# Patient Record
Sex: Male | Born: 1955 | Race: White | Hispanic: No | Marital: Married | Smoking: Never smoker
Health system: Southern US, Community
[De-identification: ages and names within clinical notes are randomized; demographics above are authoritative.]

## PROBLEM LIST (undated history)

## (undated) DIAGNOSIS — K219 Gastro-esophageal reflux disease without esophagitis: Secondary | ICD-10-CM

## (undated) DIAGNOSIS — G43909 Migraine, unspecified, not intractable, without status migrainosus: Secondary | ICD-10-CM

## (undated) HISTORY — PX: TONSILLECTOMY: SUR1361

## (undated) HISTORY — PX: KNEE ARTHROSCOPY: SUR90

---

## 2004-07-28 ENCOUNTER — Ambulatory Visit (HOSPITAL_COMMUNITY): Admission: RE | Admit: 2004-07-28 | Discharge: 2004-07-28 | Payer: Self-pay | Admitting: Orthopedic Surgery

## 2006-07-04 IMAGING — CR DG ORBITS FOR FOREIGN BODY
2 series · 2 of 2 positions shown · non-contrast
Comparison: none

CLINICAL DATA: 48-year-old machinist.  Pre-MRI orbits.
 2-VIEW ORBITS:
 Two views of the orbits demonstrate no metallic foreign bodies overlying the orbits.  There is a tiny foreign body over the maxillary sinus which should not be a problem for MRI.

[view not recorded (1 of 2)]
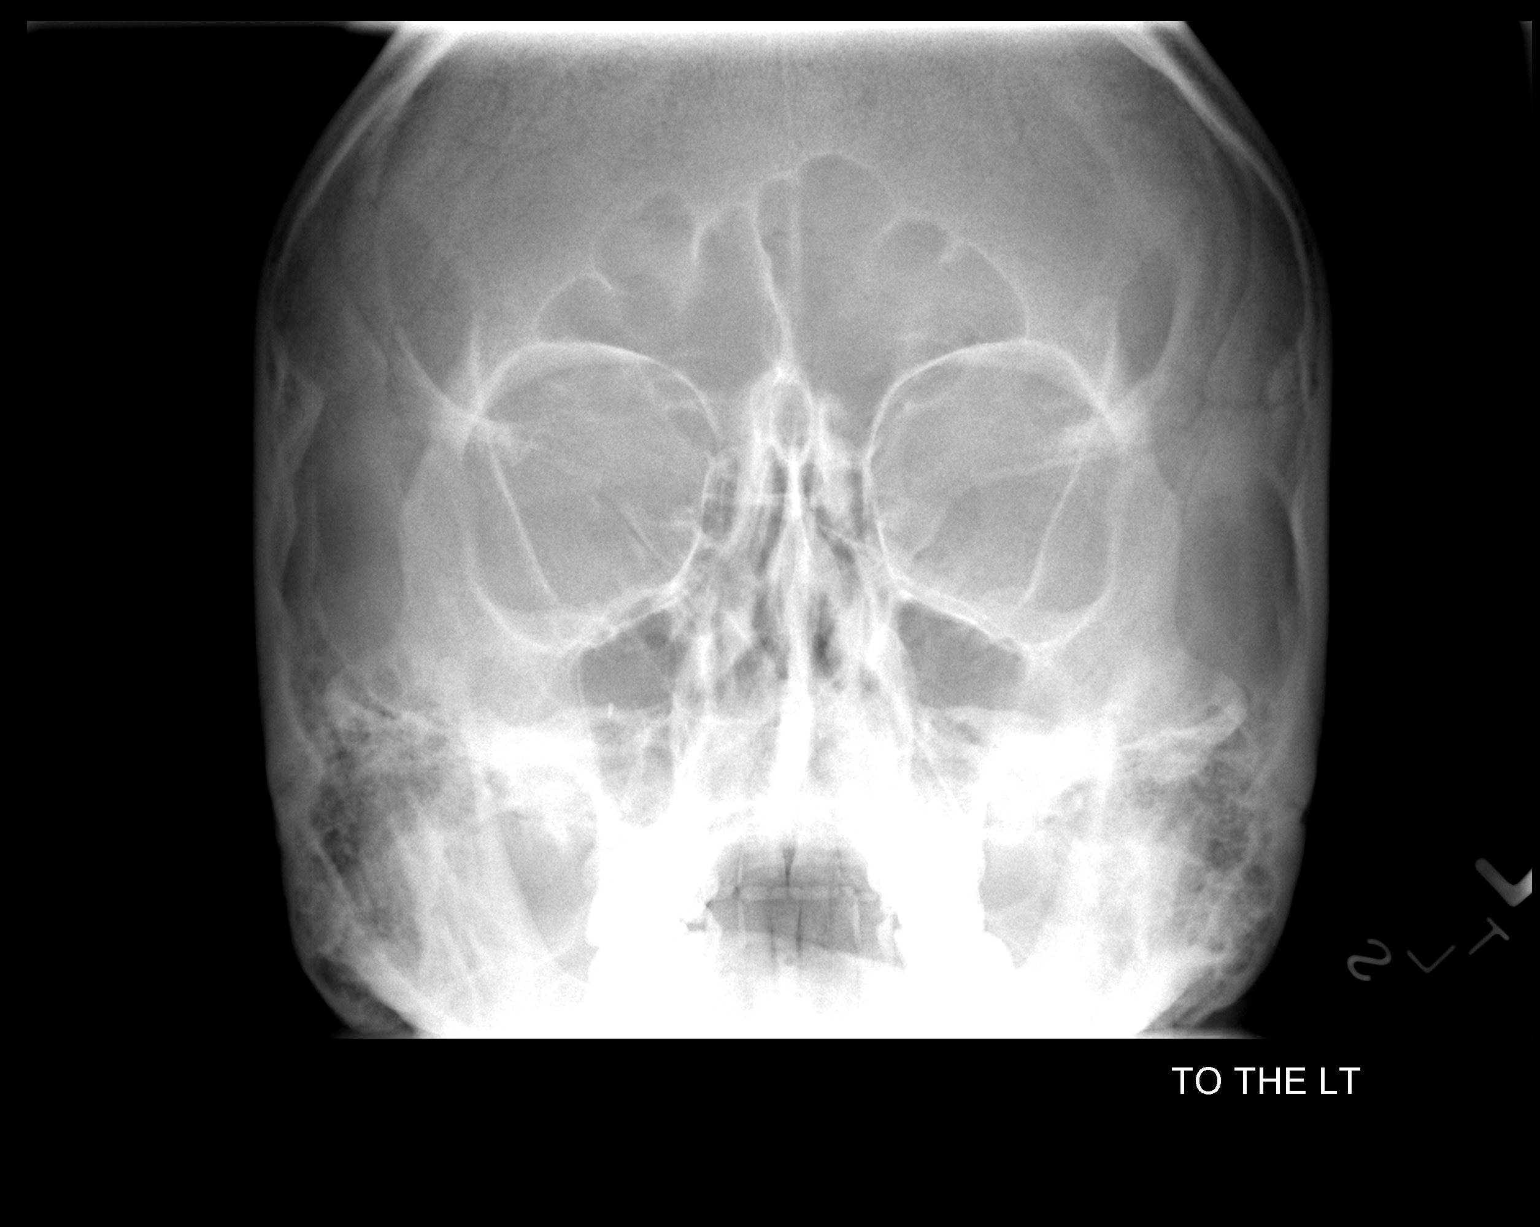

[view not recorded (2 of 2)]
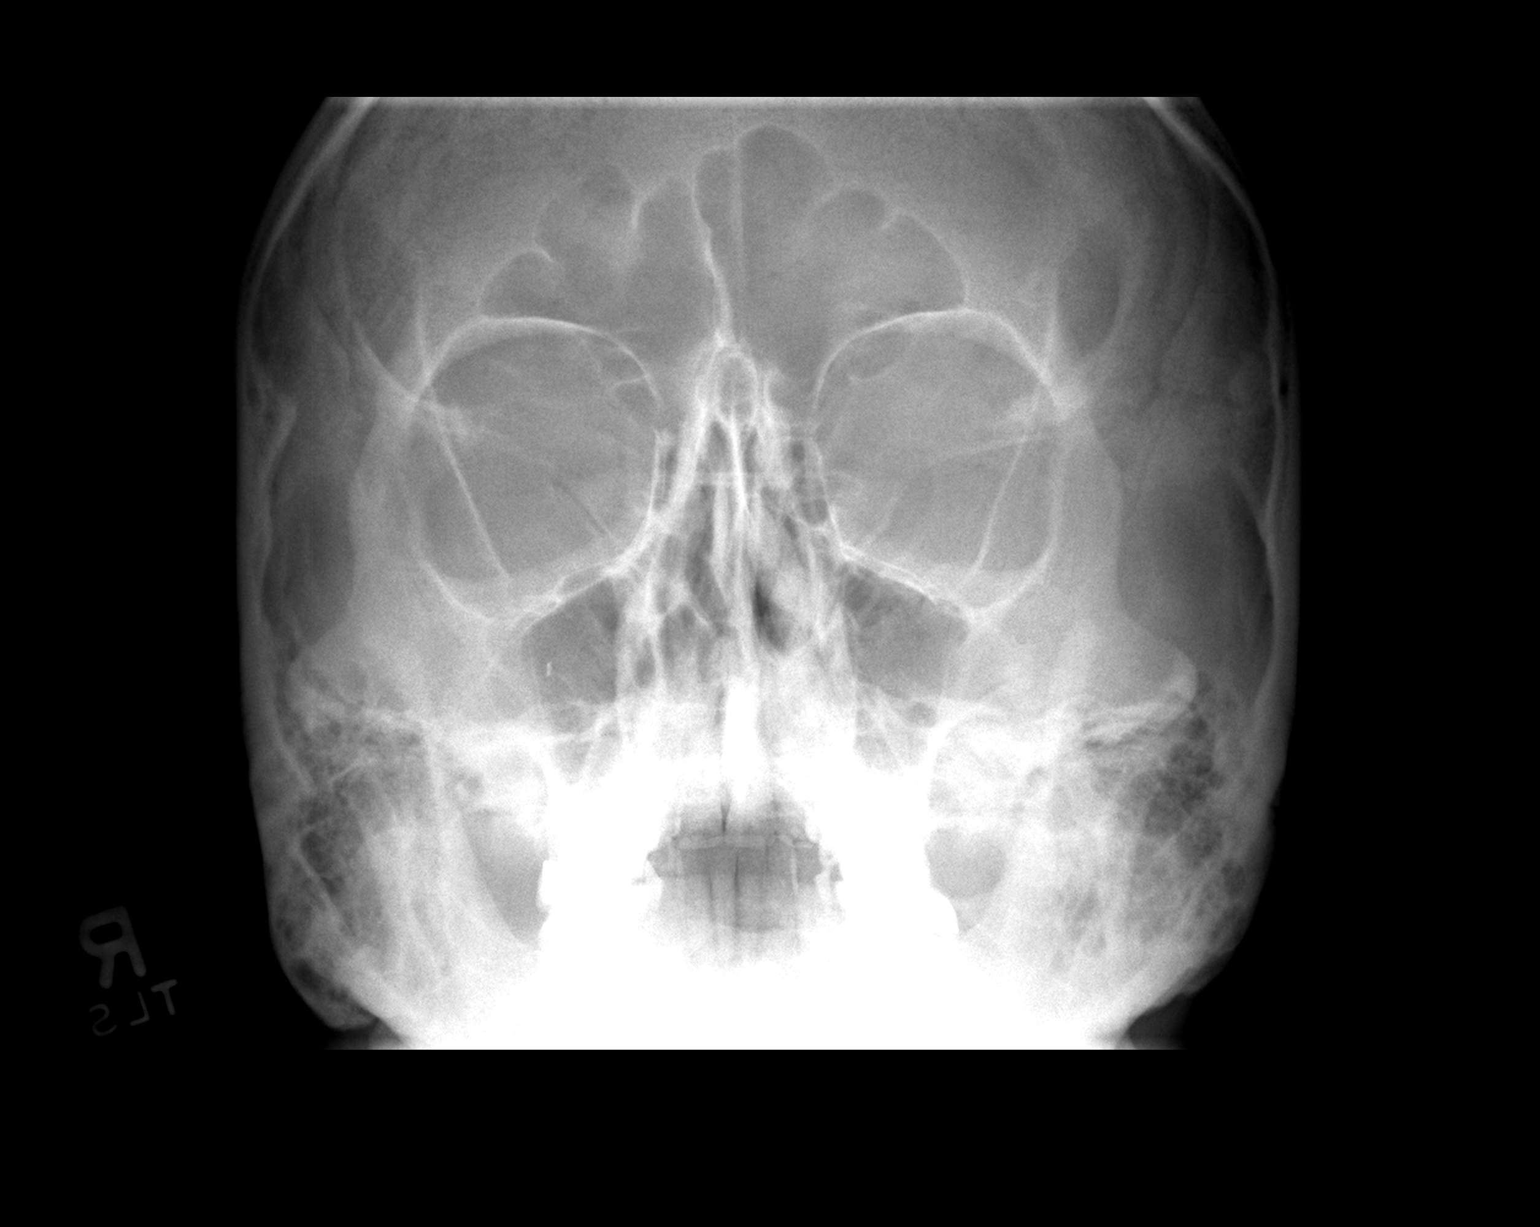

[2 of 2 positions shown; findings below may reference images not displayed]

IMPRESSION: Negative orbits for metallic foreign body.

## 2015-11-24 DIAGNOSIS — G43909 Migraine, unspecified, not intractable, without status migrainosus: Secondary | ICD-10-CM | POA: Diagnosis not present

## 2015-11-24 DIAGNOSIS — K219 Gastro-esophageal reflux disease without esophagitis: Secondary | ICD-10-CM | POA: Diagnosis not present

## 2016-11-23 DIAGNOSIS — Z23 Encounter for immunization: Secondary | ICD-10-CM | POA: Diagnosis not present

## 2016-12-04 DIAGNOSIS — Z0000 Encounter for general adult medical examination without abnormal findings: Secondary | ICD-10-CM | POA: Diagnosis not present

## 2016-12-04 DIAGNOSIS — Z125 Encounter for screening for malignant neoplasm of prostate: Secondary | ICD-10-CM | POA: Diagnosis not present

## 2016-12-04 DIAGNOSIS — Z1331 Encounter for screening for depression: Secondary | ICD-10-CM | POA: Diagnosis not present

## 2016-12-04 DIAGNOSIS — Z1339 Encounter for screening examination for other mental health and behavioral disorders: Secondary | ICD-10-CM | POA: Diagnosis not present

## 2016-12-04 DIAGNOSIS — Z79899 Other long term (current) drug therapy: Secondary | ICD-10-CM | POA: Diagnosis not present

## 2016-12-04 DIAGNOSIS — Z6826 Body mass index (BMI) 26.0-26.9, adult: Secondary | ICD-10-CM | POA: Diagnosis not present

## 2016-12-05 DIAGNOSIS — Z1211 Encounter for screening for malignant neoplasm of colon: Secondary | ICD-10-CM | POA: Diagnosis not present

## 2016-12-19 DIAGNOSIS — M25561 Pain in right knee: Secondary | ICD-10-CM | POA: Diagnosis not present

## 2016-12-24 DIAGNOSIS — Z1212 Encounter for screening for malignant neoplasm of rectum: Secondary | ICD-10-CM | POA: Diagnosis not present

## 2016-12-24 DIAGNOSIS — Z1211 Encounter for screening for malignant neoplasm of colon: Secondary | ICD-10-CM | POA: Diagnosis not present

## 2017-08-18 DIAGNOSIS — J209 Acute bronchitis, unspecified: Secondary | ICD-10-CM | POA: Diagnosis not present

## 2017-08-18 DIAGNOSIS — J0100 Acute maxillary sinusitis, unspecified: Secondary | ICD-10-CM | POA: Diagnosis not present

## 2017-11-12 DIAGNOSIS — Z0001 Encounter for general adult medical examination with abnormal findings: Secondary | ICD-10-CM | POA: Diagnosis not present

## 2017-11-12 DIAGNOSIS — Z13220 Encounter for screening for lipoid disorders: Secondary | ICD-10-CM | POA: Diagnosis not present

## 2017-11-12 DIAGNOSIS — Z125 Encounter for screening for malignant neoplasm of prostate: Secondary | ICD-10-CM | POA: Diagnosis not present

## 2017-11-12 DIAGNOSIS — Z6825 Body mass index (BMI) 25.0-25.9, adult: Secondary | ICD-10-CM | POA: Diagnosis not present

## 2017-11-12 DIAGNOSIS — G43909 Migraine, unspecified, not intractable, without status migrainosus: Secondary | ICD-10-CM | POA: Diagnosis not present

## 2017-11-12 DIAGNOSIS — Z0000 Encounter for general adult medical examination without abnormal findings: Secondary | ICD-10-CM | POA: Diagnosis not present

## 2017-11-12 DIAGNOSIS — Z23 Encounter for immunization: Secondary | ICD-10-CM | POA: Diagnosis not present

## 2017-11-12 DIAGNOSIS — Z79899 Other long term (current) drug therapy: Secondary | ICD-10-CM | POA: Diagnosis not present

## 2018-01-23 DIAGNOSIS — M25512 Pain in left shoulder: Secondary | ICD-10-CM | POA: Diagnosis not present

## 2018-02-01 DIAGNOSIS — M25512 Pain in left shoulder: Secondary | ICD-10-CM | POA: Diagnosis not present

## 2018-02-01 DIAGNOSIS — M75122 Complete rotator cuff tear or rupture of left shoulder, not specified as traumatic: Secondary | ICD-10-CM | POA: Diagnosis not present

## 2018-02-06 DIAGNOSIS — M75122 Complete rotator cuff tear or rupture of left shoulder, not specified as traumatic: Secondary | ICD-10-CM | POA: Diagnosis not present

## 2018-02-06 DIAGNOSIS — M25512 Pain in left shoulder: Secondary | ICD-10-CM | POA: Diagnosis not present

## 2018-04-14 DIAGNOSIS — E663 Overweight: Secondary | ICD-10-CM | POA: Diagnosis not present

## 2018-04-14 DIAGNOSIS — Z6827 Body mass index (BMI) 27.0-27.9, adult: Secondary | ICD-10-CM | POA: Diagnosis not present

## 2018-04-14 DIAGNOSIS — G43909 Migraine, unspecified, not intractable, without status migrainosus: Secondary | ICD-10-CM | POA: Diagnosis not present

## 2018-12-03 DIAGNOSIS — Z23 Encounter for immunization: Secondary | ICD-10-CM | POA: Diagnosis not present

## 2019-08-21 ENCOUNTER — Emergency Department (HOSPITAL_COMMUNITY)
Admission: EM | Admit: 2019-08-21 | Discharge: 2019-08-21 | Disposition: A | Discharge status: Home / Self Care | Payer: 59 | Attending: Emergency Medicine | Admitting: Emergency Medicine

## 2019-08-21 ENCOUNTER — Encounter (HOSPITAL_COMMUNITY): Payer: Self-pay

## 2019-08-21 ENCOUNTER — Other Ambulatory Visit: Payer: Self-pay

## 2019-08-21 DIAGNOSIS — Z5321 Procedure and treatment not carried out due to patient leaving prior to being seen by health care provider: Secondary | ICD-10-CM | POA: Diagnosis not present

## 2019-08-21 DIAGNOSIS — U071 COVID-19: Secondary | ICD-10-CM | POA: Diagnosis not present

## 2019-08-21 NOTE — ED Notes (Signed)
Eloped from waiting area. Called 3X.  

## 2019-08-21 NOTE — ED Triage Notes (Signed)
Pt sts covid positive 7/23. Only complaint is chills and presented to ER bc he didn't want sx to get worse.

## 2023-01-22 NOTE — H&P (Signed)
     PREOPERATIVE H&P  Chief Complaint: right shoulder cartilage disorder, impingement syndrome, biceps tendonitis, rotator cuff tear  HPI: Tristan Shelton is a 67 y.o. male who is scheduled for, Procedure(s): SHOULDER ARTHROSCOPY WITH SUBACROMIAL DECOMPRESSION, ROTATOR CUFF REPAIR AND BICEP TENDON REPAIR.   No significant PMH  RIGHT shoulder - He continues to have the inability to raise his arm overhead.    Symptoms are rated as moderate to severe, and have been worsening.  This is significantly impairing activities of daily living.    Please see clinic note for further details on this patient's care.    He has elected for surgical management.   No past medical history on file. No past surgical history on file. Social History   Socioeconomic History   Marital status: Married    Spouse name: Not on file   Number of children: Not on file   Years of education: Not on file   Highest education level: Not on file  Occupational History   Not on file  Tobacco Use   Smoking status: Not on file   Smokeless tobacco: Not on file  Substance and Sexual Activity   Alcohol use: Not on file   Drug use: Not on file   Sexual activity: Not on file  Other Topics Concern   Not on file  Social History Narrative   Not on file   Social Drivers of Health   Financial Resource Strain: Not on file  Food Insecurity: Not on file  Transportation Needs: Not on file  Physical Activity: Not on file  Stress: Not on file  Social Connections: Not on file   No family history on file. No Known Allergies Prior to Admission medications   Not on File    ROS: All other systems have been reviewed and were otherwise negative with the exception of those mentioned in the HPI and as above.  Physical Exam: General: Alert, no acute distress Cardiovascular: No pedal edema Respiratory: No cyanosis, no use of accessory musculature GI: No organomegaly, abdomen is soft and non-tender Skin: No lesions in  the area of chief complaint Neurologic: Sensation intact distally Psychiatric: Patient is competent for consent with normal mood and affect Lymphatic: No axillary or cervical lymphadenopathy  MUSCULOSKELETAL:  The range of motion of the shoulder is about 30 degrees actively, passively to 150 degrees. Weakness with all cuff testing and limitation with examination with pain and weakness.   Imaging: MRI demonstrates a full thickness supraspinatus and small subscapularis tear. There is significant fluid around the biceps and a lateral hanging acromial spur.  Assessment: right shoulder cartilage disorder, impingement syndrome, biceps tendonitis, rotator cuff tear  Plan: Plan for Procedure(s): SHOULDER ARTHROSCOPY WITH SUBACROMIAL DECOMPRESSION, ROTATOR CUFF REPAIR AND BICEP TENDON REPAIR   The risks benefits and alternatives were discussed with the patient including but not limited to the risks of nonoperative treatment, versus surgical intervention including infection, bleeding, nerve injury,  blood clots, cardiopulmonary complications, morbidity, mortality, among others, and they were willing to proceed.   The patient acknowledged the explanation, agreed to proceed with the plan and consent was signed.   Operative Plan: Right shoulder arthroscopy, rotator cuff repair, biceps tenodesis, SAD, DCE Discharge Medications: Oxy, celebrex , tylenol , zofran  DVT Prophylaxis: n/a Physical Therapy: OP ([facility]) Special Discharge needs: Iceman, sling   Barnie DELENA Mate, PA-C  01/22/2023 8:49 AM

## 2023-01-24 ENCOUNTER — Other Ambulatory Visit: Payer: Self-pay

## 2023-01-24 ENCOUNTER — Encounter (HOSPITAL_BASED_OUTPATIENT_CLINIC_OR_DEPARTMENT_OTHER): Payer: Self-pay | Admitting: Orthopaedic Surgery

## 2023-01-30 NOTE — Discharge Instructions (Addendum)
 Bonner Hair MD, MPH Aleck Stalling, PA-C [facility] [address] [phone] (tel)   [phone] (fax)   POST-OPERATIVE INSTRUCTIONS - SHOULDER ARTHROSCOPY  WOUND CARE You may remove the Operative Dressing on Post-Op Day #3 (72hrs after surgery).   Alternatively if you would like you can leave dressing on until follow-up if within 7-8 days but keep it dry. Leave steri-strips in place until they fall off on their own, usually 2 weeks postop. There may be a small amount of fluid/bleeding leaking at the surgical site.  This is normal; the shoulder is filled with fluid during the procedure and can leak for 24-48hrs after surgery.  You may change/reinforce the bandage as needed.  Use the Cryocuff or Ice as often as possible for the first 7 days, then as needed for pain relief. Always keep a towel, ACE wrap or other barrier between the cooling unit and your skin.  You may shower on Post-Op Day #3. Gently pat the area dry.  Do not soak the shoulder in water or submerge it.  Keep incisions as dry as possible. Do not go swimming in the pool or ocean until 4 weeks after surgery or when otherwise instructed.    EXERCISES Wear the sling at all times  You may remove the sling for showering, but keep the arm across the chest or in a secondary sling.     It is normal for your fingers/hand to become more swollen after surgery and discolored from bruising.   This will resolve over the first few weeks usually after surgery. Please continue to ambulate and do not stay sitting or lying for too long.  Perform foot and wrist pumps to assist in circulation.  PHYSICAL THERAPY - You will begin physical therapy soon after surgery (unless otherwise specified) - Please call to set up an appointment, if you do not already have one  - Let our office if there are any issues with scheduling your therapy   - A PT referral was sent to [facility]  REGIONAL ANESTHESIA (NERVE  BLOCKS) The anesthesia team may have performed a nerve block for you this is a great tool used to minimize pain.   The block may start wearing off overnight (between 8-24 hours postop) When the block wears off, your pain may go from nearly zero to the pain you would have had postop without the block. This is an abrupt transition but nothing dangerous is happening.   This can be a challenging period but utilize your as needed pain medications to try and manage this period. We suggest you use the pain medication the first night prior to going to bed, to ease this transition.  You may take an extra dose of narcotic when this happens if needed  POST-OP MEDICATIONS- Multimodal approach to pain control In general your pain will be controlled with a combination of substances.  Prescriptions unless otherwise discussed are electronically sent to your pharmacy.  This is a carefully made plan we use to minimize narcotic use.     Celebrex  - Anti-inflammatory medication taken on a scheduled basis Acetaminophen  - Non-narcotic pain medicine taken on a scheduled basis  Oxycodone  - This is a strong narcotic, to be used only on an "as needed" basis for SEVERE pain.  Zofran  - take as needed for nausea   FOLLOW-UP If you develop a Fever (>=101.5), Redness or Drainage from the surgical incision site, please call our office to arrange for an evaluation. Please call  the office to schedule a follow-up appointment for your first post-operative appointment, 7-10 days post-operatively.    HELPFUL INFORMATION   You may be more comfortable sleeping in a semi-seated position the first few nights following surgery.  Keep a pillow propped under the elbow and forearm for comfort.  If you have a recliner type of chair it might be beneficial.  If not that is fine too, but it would be helpful to sleep propped up with pillows behind your operated shoulder as well under your elbow and forearm.  This will reduce pulling on the  suture lines.  When dressing, put your operative arm in the sleeve first.  When getting undressed, take your operative arm out last.  Loose fitting, button-down shirts are recommended.  Often in the first days after surgery you may be more comfortable keeping your operative arm under your shirt and not through the sleeve.  You may return to work/school in the next couple of days when you feel up to it.  Desk work and typing in the sling is fine.  We suggest you use the pain medication the first night prior to going to bed, in order to ease any pain when the anesthesia wears off. You should avoid taking pain medications on an empty stomach as it will make you nauseous.  You should wean off your narcotic medicines as soon as you are able.  Most patients will be off narcotics before their first postop appointment.   Do not drink alcoholic beverages or take illicit drugs when taking pain medications.  It is against the law to drive while taking narcotics.  In some states it is against the law to drive while your arm is in a sling.   Pain medication may make you constipated.  Below are a few solutions to try in this order: Decrease the amount of pain medication if you aren't having pain. Drink lots of decaffeinated fluids. Drink prune juice and/or eat dried prunes  If the first 3 don't work start with additional solutions Take Colace - an over-the-counter stool softener Take Senokot - an over-the-counter laxative Take Miralax - a stronger over-the-counter laxative  For more information including helpful videos and documents visit our website:   [email or web address]   No Tylenol  until 12:23 pm.

## 2023-01-30 NOTE — Anesthesia Preprocedure Evaluation (Addendum)
 Anesthesia Evaluation  Patient identified by MRN, date of birth, ID band Patient awake    Reviewed: Allergy & Precautions, NPO status , Patient's Chart, lab work & pertinent test results  Airway Mallampati: II  TM Distance: >3 FB Neck ROM: Full    Dental no notable dental hx. (+) Teeth Intact, Dental Advisory Given   Pulmonary    Pulmonary exam normal breath sounds clear to auscultation       Cardiovascular negative cardio ROS Normal cardiovascular exam Rhythm:Regular Rate:Normal     Neuro/Psych  Headaches  negative psych ROS   GI/Hepatic ,GERD  ,,  Endo/Other    Renal/GU      Musculoskeletal  (+) Arthritis ,    Abdominal   Peds  Hematology negative hematology ROS (+)   Anesthesia Other Findings   Reproductive/Obstetrics                             Anesthesia Physical Anesthesia Plan  ASA: 2  Anesthesia Plan: Regional and General   Post-op Pain Management: Regional block* and Minimal or no pain anticipated   Induction: Intravenous  PONV Risk Score and Plan: 3 and Midazolam , Treatment may vary due to age or medical condition and Ondansetron   Airway Management Planned: Oral ETT  Additional Equipment: None  Intra-op Plan:   Post-operative Plan: Extubation in OR  Informed Consent: I have reviewed the patients History and Physical, chart, labs and discussed the procedure including the risks, benefits and alternatives for the proposed anesthesia with the patient or authorized representative who has indicated his/her understanding and acceptance.     Dental advisory given  Plan Discussed with: CRNA and Anesthesiologist  Anesthesia Plan Comments: (GA w R ISB)       Anesthesia Quick Evaluation

## 2023-01-31 ENCOUNTER — Ambulatory Visit (HOSPITAL_BASED_OUTPATIENT_CLINIC_OR_DEPARTMENT_OTHER)
Admission: RE | Admit: 2023-01-31 | Discharge: 2023-01-31 | Disposition: A | Discharge status: Home / Self Care | Payer: BC Managed Care – PPO | Attending: Orthopaedic Surgery | Admitting: Orthopaedic Surgery

## 2023-01-31 ENCOUNTER — Encounter (HOSPITAL_BASED_OUTPATIENT_CLINIC_OR_DEPARTMENT_OTHER): Payer: Self-pay | Admitting: Orthopaedic Surgery

## 2023-01-31 ENCOUNTER — Other Ambulatory Visit: Payer: Self-pay

## 2023-01-31 ENCOUNTER — Ambulatory Visit (HOSPITAL_BASED_OUTPATIENT_CLINIC_OR_DEPARTMENT_OTHER): Payer: BC Managed Care – PPO | Admitting: Anesthesiology

## 2023-01-31 ENCOUNTER — Encounter (HOSPITAL_BASED_OUTPATIENT_CLINIC_OR_DEPARTMENT_OTHER)
Admission: RE | Disposition: A | Discharge status: Home / Self Care | Payer: Self-pay | Source: Home / Self Care | Attending: Orthopaedic Surgery

## 2023-01-31 DIAGNOSIS — S43431A Superior glenoid labrum lesion of right shoulder, initial encounter: Secondary | ICD-10-CM | POA: Insufficient documentation

## 2023-01-31 DIAGNOSIS — X58XXXA Exposure to other specified factors, initial encounter: Secondary | ICD-10-CM | POA: Insufficient documentation

## 2023-01-31 DIAGNOSIS — M7521 Bicipital tendinitis, right shoulder: Secondary | ICD-10-CM | POA: Insufficient documentation

## 2023-01-31 DIAGNOSIS — M25811 Other specified joint disorders, right shoulder: Secondary | ICD-10-CM | POA: Diagnosis not present

## 2023-01-31 DIAGNOSIS — M19011 Primary osteoarthritis, right shoulder: Secondary | ICD-10-CM | POA: Insufficient documentation

## 2023-01-31 DIAGNOSIS — S46011A Strain of muscle(s) and tendon(s) of the rotator cuff of right shoulder, initial encounter: Secondary | ICD-10-CM | POA: Diagnosis present

## 2023-01-31 HISTORY — DX: Gastro-esophageal reflux disease without esophagitis: K21.9

## 2023-01-31 HISTORY — PX: SHOULDER ARTHROSCOPY WITH SUBACROMIAL DECOMPRESSION, ROTATOR CUFF REPAIR AND BICEP TENDON REPAIR: SHX5687

## 2023-01-31 HISTORY — DX: Migraine, unspecified, not intractable, without status migrainosus: G43.909

## 2023-01-31 SURGERY — SHOULDER ARTHROSCOPY WITH SUBACROMIAL DECOMPRESSION, ROTATOR CUFF REPAIR AND BICEP TENDON REPAIR
Anesthesia: Regional | Site: Shoulder | Laterality: Right

## 2023-01-31 MED ORDER — FENTANYL CITRATE (PF) 100 MCG/2ML IJ SOLN
50.0000 ug | Freq: Once | INTRAMUSCULAR | Status: AC
  Administered 2023-01-31: 50 ug via INTRAVENOUS

## 2023-01-31 MED ORDER — SUGAMMADEX SODIUM 200 MG/2ML IV SOLN
INTRAVENOUS | Status: DC | PRN
  Administered 2023-01-31: 200 mg via INTRAVENOUS

## 2023-01-31 MED ORDER — AMISULPRIDE (ANTIEMETIC) 5 MG/2ML IV SOLN: 10.0000 mg | Freq: Once | INTRAVENOUS | Status: DC | PRN

## 2023-01-31 MED ORDER — ACETAMINOPHEN 10 MG/ML IV SOLN: 1000.0000 mg | Freq: Once | INTRAVENOUS | Status: DC | PRN

## 2023-01-31 MED ORDER — ACETAMINOPHEN 500 MG PO TABS: 1000.0000 mg | ORAL_TABLET | Freq: Three times a day (TID) | ORAL | 0 refills | Status: AC

## 2023-01-31 MED ORDER — PHENYLEPHRINE HCL (PRESSORS) 10 MG/ML IV SOLN
INTRAVENOUS | Status: AC
  Filled 2023-01-31: qty 1

## 2023-01-31 MED ORDER — FENTANYL CITRATE (PF) 100 MCG/2ML IJ SOLN
INTRAMUSCULAR | Status: AC
  Filled 2023-01-31: qty 2

## 2023-01-31 MED ORDER — OXYCODONE HCL 5 MG/5ML PO SOLN: 5.0000 mg | Freq: Once | ORAL | Status: DC | PRN

## 2023-01-31 MED ORDER — OXYCODONE HCL 5 MG PO TABS: ORAL_TABLET | ORAL | 0 refills | Status: AC

## 2023-01-31 MED ORDER — BUPIVACAINE HCL (PF) 0.25 % IJ SOLN
INTRAMUSCULAR | Status: AC
  Filled 2023-01-31: qty 30

## 2023-01-31 MED ORDER — TRANEXAMIC ACID-NACL 1000-0.7 MG/100ML-% IV SOLN
1000.0000 mg | INTRAVENOUS | Status: AC
  Administered 2023-01-31: 1000 mg via INTRAVENOUS

## 2023-01-31 MED ORDER — PHENYLEPHRINE 80 MCG/ML (10ML) SYRINGE FOR IV PUSH (FOR BLOOD PRESSURE SUPPORT)
PREFILLED_SYRINGE | INTRAVENOUS | Status: AC
  Filled 2023-01-31: qty 10

## 2023-01-31 MED ORDER — LIDOCAINE 2% (20 MG/ML) 5 ML SYRINGE
INTRAMUSCULAR | Status: AC
  Filled 2023-01-31: qty 5

## 2023-01-31 MED ORDER — ACETAMINOPHEN 500 MG PO TABS
ORAL_TABLET | ORAL | Status: AC
  Filled 2023-01-31: qty 2

## 2023-01-31 MED ORDER — BUPIVACAINE LIPOSOME 1.3 % IJ SUSP
INTRAMUSCULAR | Status: DC | PRN
  Administered 2023-01-31: 10 mL

## 2023-01-31 MED ORDER — CELECOXIB 100 MG PO CAPS: 100.0000 mg | ORAL_CAPSULE | Freq: Two times a day (BID) | ORAL | 0 refills | Status: AC

## 2023-01-31 MED ORDER — MIDAZOLAM HCL 2 MG/2ML IJ SOLN
2.0000 mg | Freq: Once | INTRAMUSCULAR | Status: AC
  Administered 2023-01-31: 2 mg via INTRAVENOUS

## 2023-01-31 MED ORDER — ROCURONIUM BROMIDE 10 MG/ML (PF) SYRINGE
PREFILLED_SYRINGE | INTRAVENOUS | Status: DC | PRN
  Administered 2023-01-31: 60 mg via INTRAVENOUS

## 2023-01-31 MED ORDER — ACETAMINOPHEN 500 MG PO TABS
1000.0000 mg | ORAL_TABLET | Freq: Once | ORAL | Status: AC
  Administered 2023-01-31: 1000 mg via ORAL

## 2023-01-31 MED ORDER — ONDANSETRON HCL 4 MG/2ML IJ SOLN: 4.0000 mg | Freq: Once | INTRAMUSCULAR | Status: DC | PRN

## 2023-01-31 MED ORDER — HYDROMORPHONE HCL 1 MG/ML IJ SOLN: 0.2500 mg | INTRAMUSCULAR | Status: DC | PRN

## 2023-01-31 MED ORDER — EPINEPHRINE PF 1 MG/ML IJ SOLN
INTRAMUSCULAR | Status: AC
  Filled 2023-01-31: qty 1

## 2023-01-31 MED ORDER — PHENYLEPHRINE 80 MCG/ML (10ML) SYRINGE FOR IV PUSH (FOR BLOOD PRESSURE SUPPORT)
PREFILLED_SYRINGE | INTRAVENOUS | Status: DC | PRN
  Administered 2023-01-31 (×4): 40 ug via INTRAVENOUS

## 2023-01-31 MED ORDER — BUPIVACAINE HCL (PF) 0.5 % IJ SOLN
INTRAMUSCULAR | Status: DC | PRN
  Administered 2023-01-31: 15 mL via PERINEURAL

## 2023-01-31 MED ORDER — MIDAZOLAM HCL 2 MG/2ML IJ SOLN
INTRAMUSCULAR | Status: AC
Start: 2023-01-31 — End: ?
  Filled 2023-01-31: qty 2

## 2023-01-31 MED ORDER — OXYCODONE HCL 5 MG PO TABS: 5.0000 mg | ORAL_TABLET | Freq: Once | ORAL | Status: DC | PRN

## 2023-01-31 MED ORDER — LIDOCAINE 2% (20 MG/ML) 5 ML SYRINGE
INTRAMUSCULAR | Status: DC | PRN
  Administered 2023-01-31: 100 mg via INTRAVENOUS

## 2023-01-31 MED ORDER — PROPOFOL 10 MG/ML IV BOLUS
INTRAVENOUS | Status: DC | PRN
  Administered 2023-01-31: 200 mg via INTRAVENOUS

## 2023-01-31 MED ORDER — PROPOFOL 500 MG/50ML IV EMUL
INTRAVENOUS | Status: AC
  Filled 2023-01-31: qty 50

## 2023-01-31 MED ORDER — CEFAZOLIN SODIUM-DEXTROSE 2-4 GM/100ML-% IV SOLN
INTRAVENOUS | Status: AC
  Filled 2023-01-31: qty 100

## 2023-01-31 MED ORDER — CEFAZOLIN SODIUM-DEXTROSE 2-4 GM/100ML-% IV SOLN
2.0000 g | INTRAVENOUS | Status: AC
  Administered 2023-01-31: 2 g via INTRAVENOUS

## 2023-01-31 MED ORDER — DEXAMETHASONE SODIUM PHOSPHATE 10 MG/ML IJ SOLN
INTRAMUSCULAR | Status: AC
  Filled 2023-01-31: qty 1

## 2023-01-31 MED ORDER — LACTATED RINGERS IV SOLN: INTRAVENOUS | Status: DC

## 2023-01-31 MED ORDER — TRANEXAMIC ACID-NACL 1000-0.7 MG/100ML-% IV SOLN
INTRAVENOUS | Status: AC
  Filled 2023-01-31: qty 100

## 2023-01-31 MED ORDER — ONDANSETRON HCL 4 MG PO TABS: 4.0000 mg | ORAL_TABLET | Freq: Three times a day (TID) | ORAL | 0 refills | Status: AC | PRN

## 2023-01-31 MED ORDER — PHENYLEPHRINE HCL-NACL 20-0.9 MG/250ML-% IV SOLN
INTRAVENOUS | Status: DC | PRN
  Administered 2023-01-31: 50 ug/min via INTRAVENOUS

## 2023-01-31 MED ORDER — SODIUM CHLORIDE 0.9 % IR SOLN
Status: DC | PRN
  Administered 2023-01-31: 3000 mL

## 2023-01-31 MED ORDER — ROCURONIUM BROMIDE 10 MG/ML (PF) SYRINGE
PREFILLED_SYRINGE | INTRAVENOUS | Status: AC
  Filled 2023-01-31: qty 10

## 2023-01-31 MED ORDER — EPHEDRINE SULFATE-NACL 50-0.9 MG/10ML-% IV SOSY
PREFILLED_SYRINGE | INTRAVENOUS | Status: DC | PRN
  Administered 2023-01-31: 10 mg via INTRAVENOUS

## 2023-01-31 MED ORDER — ONDANSETRON HCL 4 MG/2ML IJ SOLN
INTRAMUSCULAR | Status: DC | PRN
  Administered 2023-01-31: 4 mg via INTRAVENOUS

## 2023-01-31 MED ORDER — DEXAMETHASONE SODIUM PHOSPHATE 4 MG/ML IJ SOLN
INTRAMUSCULAR | Status: DC | PRN
  Administered 2023-01-31: 10 mg via INTRAVENOUS

## 2023-01-31 MED ORDER — SODIUM CHLORIDE 0.9 % IR SOLN
Status: DC | PRN
  Administered 2023-01-31: 9000

## 2023-01-31 MED ORDER — ONDANSETRON HCL 4 MG/2ML IJ SOLN
INTRAMUSCULAR | Status: AC
  Filled 2023-01-31: qty 2

## 2023-01-31 SURGICAL SUPPLY — 52 items
ANCHOR SUT 1.8 FIBERTAK SB KL (Anchor) IMPLANT
ANCHOR SUT BIO SW 4.75X19.1 (Anchor) IMPLANT
BLADE EXCALIBUR 4.0X13 (MISCELLANEOUS) ×1 IMPLANT
BURR OVAL 8 FLU 4.0X13 (MISCELLANEOUS) ×1 IMPLANT
CANNULA 5.75X71 LONG (CANNULA) IMPLANT
CANNULA PASSPORT 5 (CANNULA) IMPLANT
CANNULA PASSPORT BUTTON 10-40 (CANNULA) IMPLANT
CANNULA TWIST IN 8.25X7CM (CANNULA) IMPLANT
CHLORAPREP W/TINT 26 (MISCELLANEOUS) ×1 IMPLANT
CLSR STERI-STRIP ANTIMIC 1/2X4 (GAUZE/BANDAGES/DRESSINGS) ×1 IMPLANT
COOLER ICEMAN CLASSIC (MISCELLANEOUS) ×1 IMPLANT
DRAPE IMP U-DRAPE 54X76 (DRAPES) ×1 IMPLANT
DRAPE INCISE IOBAN 66X45 STRL (DRAPES) IMPLANT
DRAPE SHOULDER BEACH CHAIR (DRAPES) ×1 IMPLANT
DW OUTFLOW CASSETTE/TUBE SET (MISCELLANEOUS) ×1 IMPLANT
GAUZE PAD ABD 8X10 STRL (GAUZE/BANDAGES/DRESSINGS) ×1 IMPLANT
GAUZE SPONGE 4X4 12PLY STRL (GAUZE/BANDAGES/DRESSINGS) ×1 IMPLANT
GLOVE BIO SURGEON STRL SZ 6.5 (GLOVE) ×1 IMPLANT
GLOVE BIOGEL PI IND STRL 6.5 (GLOVE) ×1 IMPLANT
GLOVE BIOGEL PI IND STRL 8 (GLOVE) ×1 IMPLANT
GLOVE ECLIPSE 8.0 STRL XLNG CF (GLOVE) ×1 IMPLANT
GOWN STRL REUS W/ TWL LRG LVL3 (GOWN DISPOSABLE) ×2 IMPLANT
GOWN STRL REUS W/TWL XL LVL3 (GOWN DISPOSABLE) ×1 IMPLANT
IMPL SPEEDBRIDGE KNOTLESS 4 (Anchor) IMPLANT
IMPLANT SPEEDBRIDGE KNOTLESS 4 (Anchor) ×1 IMPLANT
KIT SHOULDER STAB MARCO (KITS) ×1 IMPLANT
KIT STR SPEAR 1.8 FBRTK DISP (KITS) IMPLANT
LASSO CRESCENT QUICKPASS (SUTURE) IMPLANT
MANIFOLD NEPTUNE II (INSTRUMENTS) ×1 IMPLANT
NDL HD SCORPION MEGA LOADER (NEEDLE) IMPLANT
NDL SAFETY ECLIPSE 18X1.5 (NEEDLE) ×1 IMPLANT
PACK ARTHROSCOPY DSU (CUSTOM PROCEDURE TRAY) ×1 IMPLANT
PACK BASIN DAY SURGERY FS (CUSTOM PROCEDURE TRAY) ×1 IMPLANT
PAD COLD SHLDR WRAP-ON (PAD) ×1 IMPLANT
RESTRAINT HEAD UNIVERSAL NS (MISCELLANEOUS) ×1 IMPLANT
SHEET MEDIUM DRAPE 40X70 STRL (DRAPES) ×1 IMPLANT
SLEEVE SCD COMPRESS KNEE MED (STOCKING) ×1 IMPLANT
SLING ARM FOAM STRAP LRG (SOFTGOODS) IMPLANT
SUT FIBERWIRE #2 38 T-5 BLUE (SUTURE) ×1 IMPLANT
SUT MNCRL AB 4-0 PS2 18 (SUTURE) ×1 IMPLANT
SUT PDS AB 0 CT 36 (SUTURE) IMPLANT
SUT PDS AB 1 CT 36 (SUTURE) IMPLANT
SUT TIGER TAPE 7 IN WHITE (SUTURE) IMPLANT
SUTURE FIBERWR #2 38 T-5 BLUE (SUTURE) IMPLANT
SUTURE TAPE TIGERLINK 1.3MM BL (SUTURE) IMPLANT
SUTURETAPE TIGERLINK 1.3MM BL (SUTURE) IMPLANT
SYR 5ML LL (SYRINGE) ×1 IMPLANT
TAPE FIBER 2MM 7IN #2 BLUE (SUTURE) IMPLANT
TOWEL GREEN STERILE FF (TOWEL DISPOSABLE) ×2 IMPLANT
TUBE CONNECTING 20X1/4 (TUBING) ×1 IMPLANT
TUBING ARTHROSCOPY IRRIG 16FT (MISCELLANEOUS) ×1 IMPLANT
WAND ABLATOR APOLLO I90 (BUR) ×1 IMPLANT

## 2023-01-31 NOTE — Op Note (Signed)
 Orthopaedic Surgery Operative Note (CSN: [id number])  Tristan Shelton  Jun 21, 1955 Date of Surgery: 01/31/2023   DIAGNOSES: Right shoulder, acute traumatic rotator cuff tear, SLAP tear, biceps tendinitis, AC arthritis, and subacromial impingement.  POST-OPERATIVE DIAGNOSIS: same  PROCEDURE: Arthroscopic extensive debridement - 29823 Subdeltoid Bursa, Supraspinatus Tendon, Anterior Labrum, Superior Labrum, Posterior Labrum, and glenoid bone, glenoid cartilage, humeral bone and humeral cartilage Arthroscopic distal clavicle excision - 70175 Arthroscopic subacromial decompression - 70173 Arthroscopic rotator cuff repair - 70172 Arthroscopic biceps tenodesis - 70171   OPERATIVE FINDING: Exam under anesthesia: Normal Articular space:  Anterior posterior labral tearing Chondral surfaces: Normal Biceps:  Type II SLAP tear and medialization Subscapularis: Incomplete tear upper 50% torn Supraspinatus: Complete tear  Infraspinatus: Complete tear   Patient's tissue quality was moderate but bone quality was good.  This is a large tear but in the acute traumatic setting I think careful early therapy is appropriate.  We progressed to reverse if he failed.   Post-operative plan: The patient will be non-weightbearing in a sling for 6 weeks.  The patient will be discharged home.  DVT prophylaxis not indicated in ambulatory upper extremity patient without known risk factors.   Pain control with PRN pain medication preferring oral medicines.  Follow up plan will be scheduled in approximately 7 days for incision check and XR.  Surgeons:Primary: Cristy Bonner DASEN, MD Assistants:Caroline McBane, PA-C Location: [facility] OR ROOM 1 Anesthesia: General with Exparel  interscalene block Antibiotics: Ancef  Tourniquet time: None Estimated Blood Loss: Minimal Complications: None Specimens: None Implants: Implant Name Type Inv. Item Serial No. Manufacturer Lot No. LRB No. Used Action  ANCHOR SUT 1.8 FIBERTAK SB KL -  [id number] Anchor ANCHOR SUT 1.8 FIBERTAK SB KL  [employer] [id number] Right 2 Implanted  ANCHOR SUT BIO SW 4.75X19.1 - [id number] Anchor ANCHOR SUT BIO SW 4.75X19.1  [employer] [id number] Right 1 Implanted  IMPLANT SPEEDBRIDGE KNOTLESS 4 - [id number] Anchor IMPLANT SPEEDBRIDGE KNOTLESS 4  [employer] [id number] Right 1 Implanted    Indications for Surgery:   Tristan Shelton is a 68 y.o. male with continued shoulder pain refractory to nonoperative measures for extended period of time.    The risks and benefits were explained at length including but not limited to continued pain, cuff failure, biceps tenodesis failure, stiffness, need for further surgery and infection.   Procedure:   Patient was correctly identified in the preoperative holding area and operative site marked.  Patient brought to OR and positioned beachchair on an [place] table ensuring that all bony prominences were padded and the head was in an appropriate location.  Anesthesia was induced and the operative shoulder was prepped and draped in the usual sterile fashion.  Timeout was called preincision.  A standard posterior viewing portal was made after localizing the portal with a spinal needle.  An anterior accessory portal was also made.  After clearing the articular space the camera was positioned in the subacromial space.  Findings above.    Extensive debridement was performed of the anterior interval tissue, labral fraying and the bursa.  Glenoid bone, glenoid cartilage, humeral bone were all debrided.  Subacromial decompression: We made a lateral portal with spinal needle guidance. We then proceeded to debride bursal tissue extensively with a shaver and arthrocare device. At that point we continued to identify the borders of the acromion and identify the spur. We then carefully preserved the deltoid fascia and used a burr to convert the acromion to a Type 1 flat acromion without issue.  Arthroscopic  rotator cuff repair: Once the above  is complete we used a shaver as well as a bur to prepare the tuberosity and clear soft tissue so that there was bony bleeding and appropriate bloody flecks for healing.  We then placed 2 Medial Row 2.6 mm fiber tack anchors with knotless mechanisms and tape.  We used a scorpion as well as a link suture to pass all of the sutures from each anchor through the cuff.  We then were able to use the knotless mechanism sutures to perform a double medial row tiedown compressing the medial row without overtensioning. Once this was complete we cut the switch sutures and performed a speed bridge type repair pulling the tapes over to 2 lateral row 4.75 mm bio composite swivel locks.  This provided compression of the cuff to the tuberosity.  Biceps tenodesis: We marked the tendon and then performed a tenotomy and debridement of the stump in the articular space. We then identified the biceps tendon in its groove suprapec with the arthroscope in the lateral portal taking care to move from lateral to medial to avoid injury to the subscapularis. At that point we unroofed the tendon itself and mobilized it. An accessory anterior portal was made in line with the tendon and we grasped it from the anterior superior portal and worked from the accessory anterior portal. Two Fibertak 1.43mm knotless anchors were placed in the groove and the tendon was secured in a luggage loop style fashion with a pass of the limb of suture through the tendon using a scorpion device to avoid pull-through.  Repair was completed with good tension on the tendon.  Residual stump of the tendon was removed after being resected with a RF ablator.  Distal Clavicle resection:  The scope was placed in the subacromial space from the posterior portal.  A hemostat was placed through the anterior portal and we spread at the [facility] joint.  A burr was then inserted and 10 mm of distal clavicle was resected taking care to avoid damage to the capsule around the joint and  avoiding overhanging bone posteriorly.    Subscapularis Repair: We identified a subscapularis tear that involved upper 50%.  Using a grasping device were able to demonstrate that the tendon could be reapproximated to the lesser tuberosity.  We felt that it was repairable.  We then used a RF ablator to open the rotator interval and released the MGHL to allow further translation of the subscapularis.  This point we cleared both anterior and posterior to the tendon taking care to avoid inferior migration to avoid the neurovascular structures as well as the muscular cutaneous nerve.  We then used a lasso to pass a fiber tape in a mattress fashion through the subscapularis and based 4.75 swivel lock was used to repair back to the lesser tuberosity after prepping the tuberosity extensively.  We had good approximation of the tendon and a recreation of the rolled border.   The incisions were closed with absorbable monocryl and steri strips.  A sterile dressing was placed along with a sling. The patient was awoken from general anesthesia and taken to the PACU in stable condition without complication.     Tristan Stalling, PA-C, present and scrubbed throughout the case, critical for completion in a timely fashion, and for retraction, instrumentation, closure.

## 2023-01-31 NOTE — Interval H&P Note (Signed)
 All questions answered, patient wants to proceed with procedure. ? ?

## 2023-01-31 NOTE — Anesthesia Procedure Notes (Signed)
 Anesthesia Regional Block: Interscalene brachial plexus block   Pre-Anesthetic Checklist: , timeout performed,  Correct Patient, Correct Site, Correct Laterality,  Correct Procedure, Correct Position, site marked,  Risks and benefits discussed,  Surgical consent,  Pre-op evaluation,  At surgeon's request and post-op pain management  Laterality: Upper and Right  Prep: Maximum Sterile Barrier Precautions used, chloraprep       Needles:  Injection technique: Single-shot  Needle Type: Echogenic Needle     Needle Length: 5cm  Needle Gauge: 21     Additional Needles:   Procedures:,,,, ultrasound used (permanent image in chart),,    Narrative:  Start time: 01/31/2023 7:37 AM End time: 01/31/2023 7:41 AM Injection made incrementally with aspirations every 5 mL.  Performed by: Personally  Anesthesiologist: Jefm Garnette LABOR, MD  Additional Notes: Block assessed prior to procedure. Patient tolerated procedure well.

## 2023-01-31 NOTE — Progress Notes (Signed)
Assisted Dr. Valma Cava with right, interscalene , ultrasound guided block. Side rails up, monitors on throughout procedure. See vital signs in flow sheet. Tolerated Procedure well.

## 2023-01-31 NOTE — Anesthesia Procedure Notes (Signed)
 Procedure Name: Intubation Date/Time: 01/31/2023 7:57 AM  Performed by: Franchot Izetta ORN, CRNAPre-anesthesia Checklist: Patient identified, Emergency Drugs available, Suction available and Patient being monitored Patient Re-evaluated:Patient Re-evaluated prior to induction Oxygen Delivery Method: Circle system utilized Preoxygenation: Pre-oxygenation with 100% oxygen Induction Type: IV induction Ventilation: Mask ventilation without difficulty Laryngoscope Size: Miller and 2 Grade View: Grade I Tube type: Oral Tube size: 7.5 mm Number of attempts: 1 Airway Equipment and Method: Stylet and Oral airway Placement Confirmation: ETT inserted through vocal cords under direct vision, positive ETCO2 and breath sounds checked- equal and bilateral Secured at: 22 cm Tube secured with: Tape Dental Injury: Teeth and Oropharynx as per pre-operative assessment

## 2023-01-31 NOTE — Transfer of Care (Signed)
 Immediate Anesthesia Transfer of Care Note  Patient: Tristan Shelton  Procedure(s) Performed: SHOULDER ARTHROSCOPY WITH SUBACROMIAL DECOMPRESSION, ROTATOR CUFF REPAIR AND BICEP TENDON REPAIR (Right: Shoulder)  Patient Location: PACU  Anesthesia Type:General  Level of Consciousness: awake  Airway & Oxygen Therapy: Patient Spontanous Breathing and Patient connected to nasal cannula oxygen  Post-op Assessment: Report given to RN and Post -op Vital signs reviewed and stable  Post vital signs: Reviewed and stable  Last Vitals:  Vitals Value Taken Time  BP 140/88 01/31/23 0926  Temp 36.2 C 01/31/23 0926  Pulse 69 01/31/23 0927  Resp 14 01/31/23 0927  SpO2 95 % 01/31/23 0927  Vitals shown include unfiled device data.  Last Pain:  Vitals:   01/31/23 0621  TempSrc: Temporal  PainSc: 0-No pain         Complications: No notable events documented.

## 2023-01-31 NOTE — Anesthesia Postprocedure Evaluation (Signed)
 Anesthesia Post Note  Patient: Insurance Account Manager  Procedure(s) Performed: SHOULDER ARTHROSCOPY WITH SUBACROMIAL DECOMPRESSION, ROTATOR CUFF REPAIR AND BICEP TENDON REPAIR (Right: Shoulder)     Patient location during evaluation: PACU Anesthesia Type: Regional and General Level of consciousness: awake and alert Pain management: pain level controlled Vital Signs Assessment: post-procedure vital signs reviewed and stable Respiratory status: spontaneous breathing, nonlabored ventilation, respiratory function stable and patient connected to nasal cannula oxygen Cardiovascular status: blood pressure returned to baseline and stable Postop Assessment: no apparent nausea or vomiting Anesthetic complications: no   No notable events documented.  Last Vitals:  Vitals:   01/31/23 0945 01/31/23 1010  BP: (!) 133/90 134/79  Pulse: 67 (!) 16  Resp: 10 16  Temp:  (!) 36.2 C  SpO2: 94% 95%    Last Pain:  Vitals:   01/31/23 1010  TempSrc: Temporal  PainSc: 0-No pain                 Garnette DELENA Gab

## 2023-02-01 ENCOUNTER — Encounter (HOSPITAL_BASED_OUTPATIENT_CLINIC_OR_DEPARTMENT_OTHER): Payer: Self-pay | Admitting: Orthopaedic Surgery
